# Patient Record
Sex: Female | Born: 1965 | Race: Black or African American | Hispanic: No | Marital: Married | Smoking: Former smoker
Health system: Southern US, Community
[De-identification: ages and names within clinical notes are randomized; demographics above are authoritative.]

## PROBLEM LIST (undated history)

## (undated) DIAGNOSIS — E7800 Pure hypercholesterolemia, unspecified: Secondary | ICD-10-CM

## (undated) DIAGNOSIS — E559 Vitamin D deficiency, unspecified: Secondary | ICD-10-CM

## (undated) DIAGNOSIS — F99 Mental disorder, not otherwise specified: Secondary | ICD-10-CM

## (undated) HISTORY — DX: Vitamin D deficiency, unspecified: E55.9

## (undated) HISTORY — DX: Mental disorder, not otherwise specified: F99

## (undated) HISTORY — DX: Pure hypercholesterolemia, unspecified: E78.00

---

## 2003-09-02 ENCOUNTER — Emergency Department (HOSPITAL_COMMUNITY): Admission: EM | Admit: 2003-09-02 | Discharge: 2003-09-02 | Payer: Self-pay | Admitting: Emergency Medicine

## 2003-10-22 ENCOUNTER — Ambulatory Visit (HOSPITAL_COMMUNITY): Admission: RE | Admit: 2003-10-22 | Discharge: 2003-10-22 | Payer: Self-pay

## 2004-02-01 ENCOUNTER — Inpatient Hospital Stay (HOSPITAL_COMMUNITY): Admission: AD | Admit: 2004-02-01 | Discharge: 2004-02-01 | Payer: Self-pay | Admitting: Obstetrics

## 2004-03-25 ENCOUNTER — Observation Stay (HOSPITAL_COMMUNITY): Admission: AD | Admit: 2004-03-25 | Discharge: 2004-03-25 | Payer: Self-pay | Admitting: Obstetrics

## 2004-03-30 ENCOUNTER — Inpatient Hospital Stay (HOSPITAL_COMMUNITY): Admission: AD | Admit: 2004-03-30 | Discharge: 2004-03-30 | Payer: Self-pay | Admitting: Obstetrics

## 2004-03-30 ENCOUNTER — Inpatient Hospital Stay (HOSPITAL_COMMUNITY): Admission: AD | Admit: 2004-03-30 | Discharge: 2004-04-02 | Payer: Self-pay | Admitting: Obstetrics

## 2004-07-18 ENCOUNTER — Emergency Department (HOSPITAL_COMMUNITY): Admission: EM | Admit: 2004-07-18 | Discharge: 2004-07-19 | Payer: Self-pay | Admitting: Emergency Medicine

## 2006-03-04 ENCOUNTER — Inpatient Hospital Stay: Payer: Self-pay | Admitting: Unknown Physician Specialty

## 2008-02-18 ENCOUNTER — Encounter: Admission: RE | Admit: 2008-02-18 | Discharge: 2008-02-18 | Payer: Self-pay | Admitting: Obstetrics & Gynecology

## 2009-10-30 ENCOUNTER — Inpatient Hospital Stay: Payer: Self-pay | Admitting: Psychiatry

## 2010-03-10 ENCOUNTER — Ambulatory Visit (HOSPITAL_COMMUNITY): Admission: RE | Admit: 2010-03-10 | Discharge: 2010-03-10 | Payer: Self-pay | Admitting: Obstetrics & Gynecology

## 2010-11-27 ENCOUNTER — Encounter: Payer: Self-pay | Admitting: Obstetrics & Gynecology

## 2010-11-27 ENCOUNTER — Encounter: Payer: Self-pay | Admitting: Family Medicine

## 2013-01-01 ENCOUNTER — Other Ambulatory Visit (HOSPITAL_COMMUNITY): Payer: Self-pay | Admitting: Obstetrics & Gynecology

## 2013-01-01 DIAGNOSIS — Z1231 Encounter for screening mammogram for malignant neoplasm of breast: Secondary | ICD-10-CM

## 2013-01-15 ENCOUNTER — Ambulatory Visit (HOSPITAL_COMMUNITY)
Admission: RE | Admit: 2013-01-15 | Discharge: 2013-01-15 | Disposition: A | Discharge status: Home / Self Care | Payer: BC Managed Care – PPO | Source: Ambulatory Visit | Attending: Obstetrics & Gynecology | Admitting: Obstetrics & Gynecology

## 2013-01-15 DIAGNOSIS — Z1231 Encounter for screening mammogram for malignant neoplasm of breast: Secondary | ICD-10-CM

## 2013-01-17 ENCOUNTER — Other Ambulatory Visit: Payer: Self-pay | Admitting: Obstetrics & Gynecology

## 2013-01-17 DIAGNOSIS — R928 Other abnormal and inconclusive findings on diagnostic imaging of breast: Secondary | ICD-10-CM

## 2013-02-07 ENCOUNTER — Ambulatory Visit
Admission: RE | Admit: 2013-02-07 | Discharge: 2013-02-07 | Disposition: A | Discharge status: Home / Self Care | Payer: BC Managed Care – PPO | Source: Ambulatory Visit | Attending: Obstetrics & Gynecology | Admitting: Obstetrics & Gynecology

## 2013-02-07 ENCOUNTER — Encounter: Payer: Self-pay | Admitting: Obstetrics & Gynecology

## 2013-02-07 DIAGNOSIS — R928 Other abnormal and inconclusive findings on diagnostic imaging of breast: Secondary | ICD-10-CM

## 2013-08-28 ENCOUNTER — Other Ambulatory Visit: Payer: Self-pay | Admitting: Obstetrics & Gynecology

## 2013-08-28 DIAGNOSIS — N649 Disorder of breast, unspecified: Secondary | ICD-10-CM

## 2013-09-12 ENCOUNTER — Ambulatory Visit
Admission: RE | Admit: 2013-09-12 | Discharge: 2013-09-12 | Disposition: A | Discharge status: Home / Self Care | Payer: BC Managed Care – PPO | Source: Ambulatory Visit | Attending: Obstetrics & Gynecology | Admitting: Obstetrics & Gynecology

## 2013-09-12 DIAGNOSIS — N649 Disorder of breast, unspecified: Secondary | ICD-10-CM

## 2014-04-27 ENCOUNTER — Other Ambulatory Visit: Payer: Self-pay | Admitting: Obstetrics & Gynecology

## 2014-04-27 DIAGNOSIS — R922 Inconclusive mammogram: Secondary | ICD-10-CM

## 2014-04-27 DIAGNOSIS — R9230 Dense breasts, unspecified: Secondary | ICD-10-CM

## 2014-05-06 ENCOUNTER — Ambulatory Visit
Admission: RE | Admit: 2014-05-06 | Discharge: 2014-05-06 | Disposition: A | Discharge status: Home / Self Care | Payer: BC Managed Care – PPO | Source: Ambulatory Visit | Attending: Obstetrics & Gynecology | Admitting: Obstetrics & Gynecology

## 2014-05-06 ENCOUNTER — Encounter (INDEPENDENT_AMBULATORY_CARE_PROVIDER_SITE_OTHER): Payer: Self-pay

## 2014-05-06 DIAGNOSIS — R922 Inconclusive mammogram: Secondary | ICD-10-CM

## 2014-06-29 ENCOUNTER — Encounter: Payer: Self-pay | Admitting: Obstetrics & Gynecology

## 2014-06-29 ENCOUNTER — Ambulatory Visit (INDEPENDENT_AMBULATORY_CARE_PROVIDER_SITE_OTHER): Payer: BC Managed Care – PPO | Admitting: Obstetrics & Gynecology

## 2014-06-29 VITALS — BP 120/80 | Temp 97.3°F | Ht 68.0 in | Wt 161.0 lb

## 2014-06-29 DIAGNOSIS — Z30433 Encounter for removal and reinsertion of intrauterine contraceptive device: Secondary | ICD-10-CM

## 2014-06-29 DIAGNOSIS — Z01812 Encounter for preprocedural laboratory examination: Secondary | ICD-10-CM

## 2014-06-29 DIAGNOSIS — Z01419 Encounter for gynecological examination (general) (routine) without abnormal findings: Secondary | ICD-10-CM

## 2014-06-29 DIAGNOSIS — Z113 Encounter for screening for infections with a predominantly sexual mode of transmission: Secondary | ICD-10-CM

## 2014-06-29 DIAGNOSIS — Z30430 Encounter for insertion of intrauterine contraceptive device: Secondary | ICD-10-CM

## 2014-06-29 DIAGNOSIS — Z3202 Encounter for pregnancy test, result negative: Secondary | ICD-10-CM

## 2014-06-29 LAB — POCT URINE PREGNANCY: Preg Test, Ur: NEGATIVE

## 2014-06-30 LAB — HIV ANTIBODY (ROUTINE TESTING W REFLEX): HIV 1&2 Ab, 4th Generation: NONREACTIVE

## 2014-06-30 LAB — RPR

## 2014-06-30 NOTE — Progress Notes (Signed)
IUD Insertion Procedure Note  Pre-operative Diagnosis: Requests removal/insertion of a Paragard IUD  Post-operative Diagnosis: same  Indications: contraception  Procedure Details  Urine pregnancy test was done and result was negative.  The risks (including infection, bleeding, pain, and uterine perforation) and benefits of the procedure were explained to the patient and Written informed consent was obtained.     The IUD string was grasped and the IUD was removed intact. Cervix cleansed with Betadine. Uterus sounded to 8 cm. IUD inserted without difficulty. String visible and trimmed. Patient tolerated procedure well.  IUD Information: ParaGard.  Condition: Stable  Complications: None  Plan:  The patient was advised to call for any fever or for prolonged or severe pain or bleeding. She was advised to use OTC analgesics as needed for mild to moderate pain.   Subjective:     Jennifer Wang is a 48 y.o. female here for a routine exam.  Current complaints: none.    Personal health questionnaire:  Is patient Ashkenazi Jewish, have a family history of breast and/or ovarian cancer: no Is there a family history of uterine cancer diagnosed at age < 4, gastrointestinal cancer, urinary tract cancer, family member who is a Personnel officer syndrome-associated carrier: no Is the patient overweight and hypertensive, family history of diabetes, personal history of gestational diabetes or PCOS: no Is patient over 52, have PCOS,  family history of premature CHD under age 60, diabetes, smoke, have hypertension or peripheral artery disease:  yes At any time, has a partner hit, kicked or otherwise hurt or frightened you?: no Over the past 2 weeks, have you felt down, depressed or hopeless?: no Over the past 2 weeks, have you felt little interest or pleasure in doing things?:no   Gynecologic History Patient's last menstrual period was 06/26/2014. Contraception: IUD Last Pap: 2014. Results were:  abnormal, LSIL Last mammogram results were: normal  Obstetric History OB History  No data available    Past Medical History  Diagnosis Date  . High cholesterol   . Mental disorder     Bipolar Disorder  . Vitamin D deficiency     History reviewed. No pertinent past surgical history.  Current outpatient prescriptions:Atorvastatin Calcium (LIPITOR PO), Take by mouth., Disp: , Rfl: ;  CarBAMazepine (TEGRETOL PO), Take by mouth., Disp: , Rfl: ;  Cholecalciferol (VITAMIN D PO), Take by mouth., Disp: , Rfl: ;  Ziprasidone HCl (GEODON PO), Take by mouth., Disp: , Rfl:  No Known Allergies  History  Substance Use Topics  . Smoking status: Former Games developer  . Smokeless tobacco: Never Used  . Alcohol Use: No    History reviewed. No pertinent family history.    Review of Systems  Constitutional: negative for fatigue and weight loss Respiratory: negative for cough and wheezing Cardiovascular: negative for chest pain, fatigue and palpitations Gastrointestinal: negative for abdominal pain and change in bowel habits Musculoskeletal:negative for myalgias Neurological: negative for gait problems and tremors Behavioral/Psych: negative for abusive relationship, depression Endocrine: negative for temperature intolerance   Genitourinary:negative for abnormal menstrual periods, genital lesions, hot flashes, sexual problems and vaginal discharge Integument/breast: negative for breast lump, breast tenderness, nipple discharge and skin lesion(s)    Objective:       BP 120/80  Temp(Src) 97.3 F (36.3 C)  Ht  (1.727 m)  Wt 73.029 kg (161 lb)  BMI 24.49 kg/m2  LMP 06/26/2014 General:   alert  Skin:   no rash or abnormalities  Lungs:   clear to auscultation bilaterally  Heart:  regular rate and rhythm, S1, S2 normal, no murmur, click, rub or gallop  Breasts:   normal without suspicious masses, skin or nipple changes or axillary nodes  Abdomen:  normal findings: no organomegaly, soft,  non-tender and no hernia  Pelvis:  External genitalia: normal general appearance Urinary system: urethral meatus normal and bladder without fullness, nontender Vaginal: normal without tenderness, induration or masses Cervix: normal appearance Adnexa: normal bimanual exam Uterus: anteverted and non-tender, normal size   Lab Review Urine pregnancy test Labs reviewed yes Radiologic studies reviewed no    Assessment:    Healthy female exam.  S/P ParaGard IUD removal/placement   Plan:    Education reviewed: calcium supplements, low fat, low cholesterol diet and weight bearing exercise.   Meds ordered this encounter  Medications  . CarBAMazepine (TEGRETOL PO)    Sig: Take by mouth.  . Atorvastatin Calcium (LIPITOR PO)    Sig: Take by mouth.  . Cholecalciferol (VITAMIN D PO)    Sig: Take by mouth.  . Ziprasidone HCl (GEODON PO)    Sig: Take by mouth.   Orders Placed This Encounter  Procedures  . HIV antibody  . RPR  . POCT urine pregnancy    Follow up as needed.

## 2014-06-30 NOTE — Patient Instructions (Signed)

## 2014-07-02 LAB — PAP IG, CT-NG NAA, HPV HIGH-RISK
Chlamydia Probe Amp: NEGATIVE
GC Probe Amp: NEGATIVE
HPV DNA High Risk: NOT DETECTED

## 2014-09-21 ENCOUNTER — Ambulatory Visit: Payer: Self-pay | Admitting: Obstetrics & Gynecology

## 2014-11-02 ENCOUNTER — Encounter: Payer: Self-pay | Admitting: *Deleted

## 2014-11-03 ENCOUNTER — Encounter: Payer: Self-pay | Admitting: Obstetrics & Gynecology

## 2016-01-19 ENCOUNTER — Other Ambulatory Visit: Payer: Self-pay | Admitting: Family Medicine

## 2016-01-19 DIAGNOSIS — Z1231 Encounter for screening mammogram for malignant neoplasm of breast: Secondary | ICD-10-CM

## 2016-02-04 ENCOUNTER — Ambulatory Visit
Admission: RE | Admit: 2016-02-04 | Discharge: 2016-02-04 | Disposition: A | Discharge status: Home / Self Care | Payer: BLUE CROSS/BLUE SHIELD | Source: Ambulatory Visit | Attending: Family Medicine | Admitting: Family Medicine

## 2016-02-04 DIAGNOSIS — Z1231 Encounter for screening mammogram for malignant neoplasm of breast: Secondary | ICD-10-CM

## 2019-08-13 ENCOUNTER — Ambulatory Visit
Admission: RE | Admit: 2019-08-13 | Discharge: 2019-08-13 | Disposition: A | Discharge status: Home / Self Care | Payer: BC Managed Care – PPO | Source: Ambulatory Visit | Attending: Family Medicine | Admitting: Family Medicine

## 2019-08-13 ENCOUNTER — Other Ambulatory Visit: Payer: Self-pay | Admitting: Family Medicine

## 2019-08-13 DIAGNOSIS — T1490XA Injury, unspecified, initial encounter: Secondary | ICD-10-CM

## 2019-08-20 ENCOUNTER — Other Ambulatory Visit: Payer: Self-pay | Admitting: Family Medicine

## 2019-08-20 DIAGNOSIS — Z1231 Encounter for screening mammogram for malignant neoplasm of breast: Secondary | ICD-10-CM

## 2019-10-08 ENCOUNTER — Ambulatory Visit: Payer: BC Managed Care – PPO

## 2020-08-20 ENCOUNTER — Other Ambulatory Visit: Payer: Self-pay | Admitting: Family Medicine

## 2020-08-20 DIAGNOSIS — Z1231 Encounter for screening mammogram for malignant neoplasm of breast: Secondary | ICD-10-CM

## 2020-08-23 ENCOUNTER — Ambulatory Visit: Payer: BC Managed Care – PPO

## 2020-08-23 ENCOUNTER — Other Ambulatory Visit: Payer: Self-pay

## 2022-02-15 ENCOUNTER — Encounter: Payer: Self-pay | Admitting: Obstetrics & Gynecology

## 2022-02-15 ENCOUNTER — Ambulatory Visit (INDEPENDENT_AMBULATORY_CARE_PROVIDER_SITE_OTHER): Payer: 59 | Admitting: Obstetrics & Gynecology

## 2022-02-15 ENCOUNTER — Other Ambulatory Visit (HOSPITAL_COMMUNITY)
Admission: RE | Admit: 2022-02-15 | Discharge: 2022-02-15 | Disposition: A | Discharge status: Home / Self Care | Payer: 59 | Source: Ambulatory Visit | Attending: Obstetrics & Gynecology | Admitting: Obstetrics & Gynecology

## 2022-02-15 VITALS — BP 101/51 | HR 76 | Ht 65.0 in | Wt 195.0 lb

## 2022-02-15 DIAGNOSIS — Z01419 Encounter for gynecological examination (general) (routine) without abnormal findings: Secondary | ICD-10-CM

## 2022-02-15 DIAGNOSIS — Z1231 Encounter for screening mammogram for malignant neoplasm of breast: Secondary | ICD-10-CM

## 2022-02-15 DIAGNOSIS — Z1211 Encounter for screening for malignant neoplasm of colon: Secondary | ICD-10-CM

## 2022-02-15 NOTE — Progress Notes (Signed)
? ? ?GYNECOLOGY ANNUAL PREVENTATIVE CARE ENCOUNTER NOTE ? ?History:    ? Jennifer Wang is a postmenopausal 56 y.o. [id number] female here for a routine annual gynecologic exam.  Current complaints: none.   Denies abnormal vaginal bleeding, discharge, pelvic pain, problems with intercourse or other gynecologic concerns.  ?  ?Gynecologic History ?Patient's last menstrual period was 06/26/2014. ?Contraception: IUD in place ?Last Pap: 06/29/2014. Result was normal with negative HPV ?Last Mammogram: 02/04/2016.  Result was normal ?Last Colonoscopy: never had one ? ?Obstetric History ?OB History  ?Gravida Para Term Preterm AB Living  ?4 4 4     3   ?SAB IAB Ectopic Multiple Live Births  ?        3  ?  ?# Outcome Date GA Lbr Len/2nd Weight Sex Delivery Anes PTL Lv  ?4 Term 2005 [redacted]w[redacted]d   F Vag-Spont None N LIV  ?3 Term 2003 [redacted]w[redacted]d   F Vag-Spont None N   ?2 Term 1998 [redacted]w[redacted]d   F Vag-Spont Local N LIV  ?1 Term 1996 [redacted]w[redacted]d   M Vag-Spont None N LIV  ? ? ?Past Medical History:  ?Diagnosis Date  ? High cholesterol   ? Mental disorder   ? Bipolar Disorder  ? Vitamin D deficiency   ? ? ?History reviewed. No pertinent surgical history. ? ?Current Outpatient Medications on File Prior to Visit  ?Medication Sig Dispense Refill  ? Ziprasidone HCl (GEODON PO) Take by mouth.    ? Atorvastatin Calcium (LIPITOR PO) Take by mouth. (Patient not taking: Reported on 02/15/2022)    ? CarBAMazepine (TEGRETOL PO) Take by mouth. (Patient not taking: Reported on 02/15/2022)    ? Cholecalciferol (VITAMIN D PO) Take by mouth. (Patient not taking: Reported on 02/15/2022)    ? ?No current facility-administered medications on file prior to visit.  ? ? ?No Known Allergies ? ?Social History:  reports that she has quit smoking. She has never used smokeless tobacco. She reports that she does not drink alcohol and does not use drugs. ? ?History reviewed. No pertinent family history. ? ?The following portions of the patient's history were reviewed and updated as  appropriate: allergies, current medications, past family history, past medical history, past social history, past surgical history and problem list. ? ?Review of Systems ?Pertinent items noted in HPI and remainder of comprehensive ROS otherwise negative. ? ?Physical Exam:  ?BP (!) 101/51   Pulse 76   Ht 5\' 5"  (1.651 m)   Wt 195 lb (88.5 kg)   LMP 06/26/2014   BMI 32.45 kg/m?  ?CONSTITUTIONAL: Well-developed, well-nourished female in no acute distress.  ?HENT:  Normocephalic, atraumatic, External right and left ear normal.  ?EYES: Conjunctivae and EOM are normal. Pupils are equal, round, and reactive to light. No scleral icterus.  ?NECK: Normal range of motion, supple, no masses.  Normal thyroid.  ?SKIN: Skin is warm and dry. No rash noted. Not diaphoretic. No erythema. No pallor. ?MUSCULOSKELETAL: Normal range of motion. No tenderness.  No cyanosis, clubbing, or edema. ?NEUROLOGIC: Alert and oriented to person, place, and time. Normal reflexes, muscle tone coordination.  ?PSYCHIATRIC: Normal mood and affect. Normal behavior. Normal judgment and thought content. ?CARDIOVASCULAR: Normal heart rate noted, regular rhythm ?RESPIRATORY: Clear to auscultation bilaterally. Effort and breath sounds normal, no problems with respiration noted. ?BREASTS: Symmetric in size. No masses, tenderness, skin changes, nipple drainage, or lymphadenopathy bilaterally. Performed in the presence of a chaperone. ?ABDOMEN: Soft, no distention noted.  No tenderness, rebound or guarding.  ?PELVIC: Normal appearing  external genitalia and urethral meatus; normal appearing vaginal mucosa and cervix.  No abnormal vaginal discharge noted.  Pap smear obtained, there was some bleeding ameliorated by silver nitrate. IUD strings seen about 7 mm in length outside cervix.  Normal uterine size, no other palpable masses, no uterine or adnexal tenderness.  Performed in the presence of a chaperone. ?  ?Assessment and Plan:  ?   ?1. Screening for colon  cancer ?Referral made to Gastroenterology for colonoscopy ?- Ambulatory referral to Gastroenterology ? ?2. Breast cancer screening by mammogram ?Mammogram to be scheduled ?- MM 3D SCREEN BREAST BILATERAL; Future ? ?3. Well woman exam with routine gynecological exam ?- Cytology - PAP( [place]) ?Will follow up results of pap smear and manage accordingly. ?Routine preventative health maintenance measures emphasized. ?Please refer to After Visit Summary for other counseling recommendations.  ?   ? ?Verita Schneiders, MD, FACOG ?Obstetrician Social research officer, government, Faculty Practice ?[facility] ?

## 2022-02-17 LAB — CYTOLOGY - PAP
Comment: NEGATIVE
Diagnosis: NEGATIVE
High risk HPV: NEGATIVE

## 2022-02-21 ENCOUNTER — Encounter (HOSPITAL_BASED_OUTPATIENT_CLINIC_OR_DEPARTMENT_OTHER): Payer: Self-pay

## 2022-02-21 ENCOUNTER — Ambulatory Visit (HOSPITAL_BASED_OUTPATIENT_CLINIC_OR_DEPARTMENT_OTHER)
Admission: RE | Admit: 2022-02-21 | Discharge: 2022-02-21 | Disposition: A | Discharge status: Home / Self Care | Payer: 59 | Source: Ambulatory Visit | Attending: Obstetrics & Gynecology | Admitting: Obstetrics & Gynecology

## 2022-02-21 DIAGNOSIS — Z1231 Encounter for screening mammogram for malignant neoplasm of breast: Secondary | ICD-10-CM | POA: Insufficient documentation

## 2023-01-05 ENCOUNTER — Emergency Department (HOSPITAL_BASED_OUTPATIENT_CLINIC_OR_DEPARTMENT_OTHER)
Admission: EM | Admit: 2023-01-05 | Discharge: 2023-01-05 | Disposition: A | Discharge status: Home / Self Care | Payer: No Typology Code available for payment source | Attending: Emergency Medicine | Admitting: Emergency Medicine

## 2023-01-05 ENCOUNTER — Emergency Department (HOSPITAL_BASED_OUTPATIENT_CLINIC_OR_DEPARTMENT_OTHER): Payer: No Typology Code available for payment source

## 2023-01-05 ENCOUNTER — Encounter (HOSPITAL_BASED_OUTPATIENT_CLINIC_OR_DEPARTMENT_OTHER): Payer: Self-pay | Admitting: *Deleted

## 2023-01-05 ENCOUNTER — Other Ambulatory Visit: Payer: Self-pay

## 2023-01-05 DIAGNOSIS — M25511 Pain in right shoulder: Secondary | ICD-10-CM | POA: Insufficient documentation

## 2023-01-05 DIAGNOSIS — M25512 Pain in left shoulder: Secondary | ICD-10-CM | POA: Insufficient documentation

## 2023-01-05 DIAGNOSIS — M542 Cervicalgia: Secondary | ICD-10-CM | POA: Diagnosis not present

## 2023-01-05 DIAGNOSIS — M79652 Pain in left thigh: Secondary | ICD-10-CM | POA: Insufficient documentation

## 2023-01-05 DIAGNOSIS — R519 Headache, unspecified: Secondary | ICD-10-CM | POA: Diagnosis not present

## 2023-01-05 DIAGNOSIS — M25552 Pain in left hip: Secondary | ICD-10-CM | POA: Insufficient documentation

## 2023-01-05 DIAGNOSIS — M79651 Pain in right thigh: Secondary | ICD-10-CM | POA: Diagnosis not present

## 2023-01-05 DIAGNOSIS — Y92410 Unspecified street and highway as the place of occurrence of the external cause: Secondary | ICD-10-CM | POA: Diagnosis not present

## 2023-01-05 DIAGNOSIS — S3992XA Unspecified injury of lower back, initial encounter: Secondary | ICD-10-CM | POA: Diagnosis present

## 2023-01-05 DIAGNOSIS — S39012A Strain of muscle, fascia and tendon of lower back, initial encounter: Secondary | ICD-10-CM | POA: Diagnosis not present

## 2023-01-05 DIAGNOSIS — M25551 Pain in right hip: Secondary | ICD-10-CM | POA: Insufficient documentation

## 2023-01-05 MED ORDER — ACETAMINOPHEN 500 MG PO TABS: 1000.0000 mg | ORAL_TABLET | Freq: Once | ORAL | Status: DC
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
## 2023-02-07 IMAGING — MG MM DIGITAL SCREENING BILAT W/ TOMO AND CAD
6 of 10 series · 6 of 30 positions shown · non-contrast
Comparison: Previous exam(s).

CLINICAL DATA: Screening.

EXAM:
DIGITAL SCREENING BILATERAL MAMMOGRAM WITH TOMOSYNTHESIS AND CAD
TECHNIQUE: Bilateral screening digital craniocaudal and mediolateral oblique
mammograms were obtained. Bilateral screening digital breast
tomosynthesis was performed. The images were evaluated with
computer-aided detection.

[R MLO synth-2D]
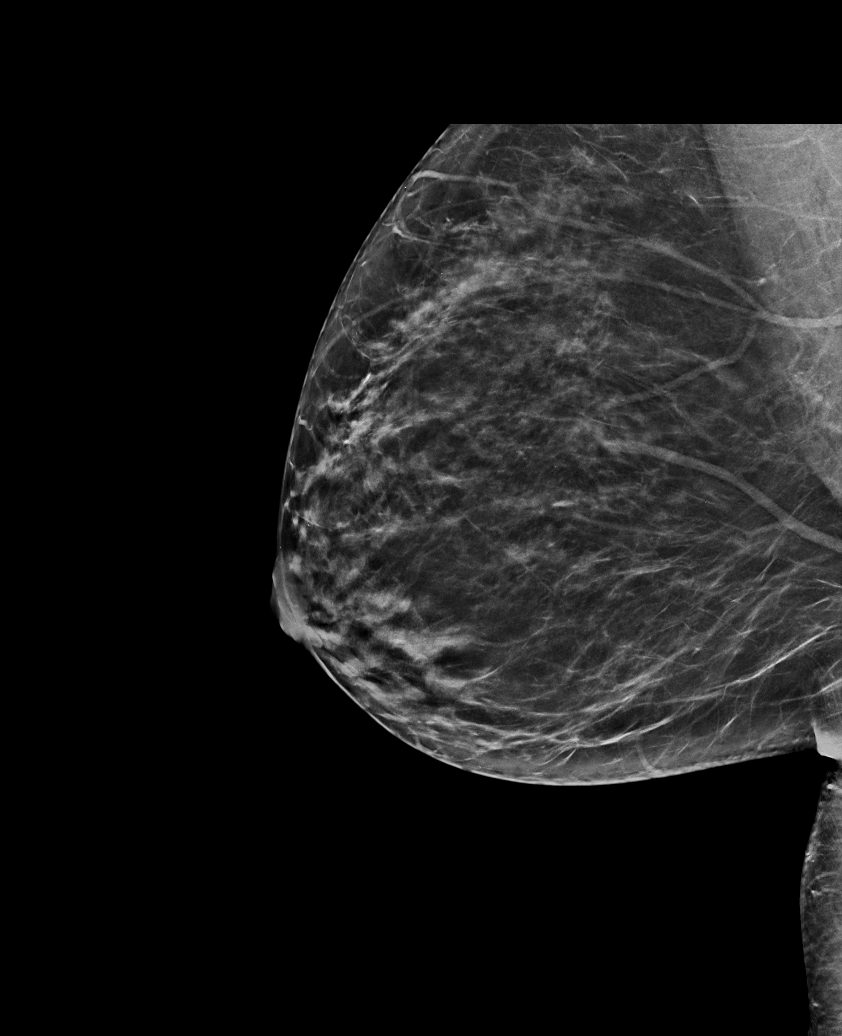

[L MLO synth-2D (1 of 2)]
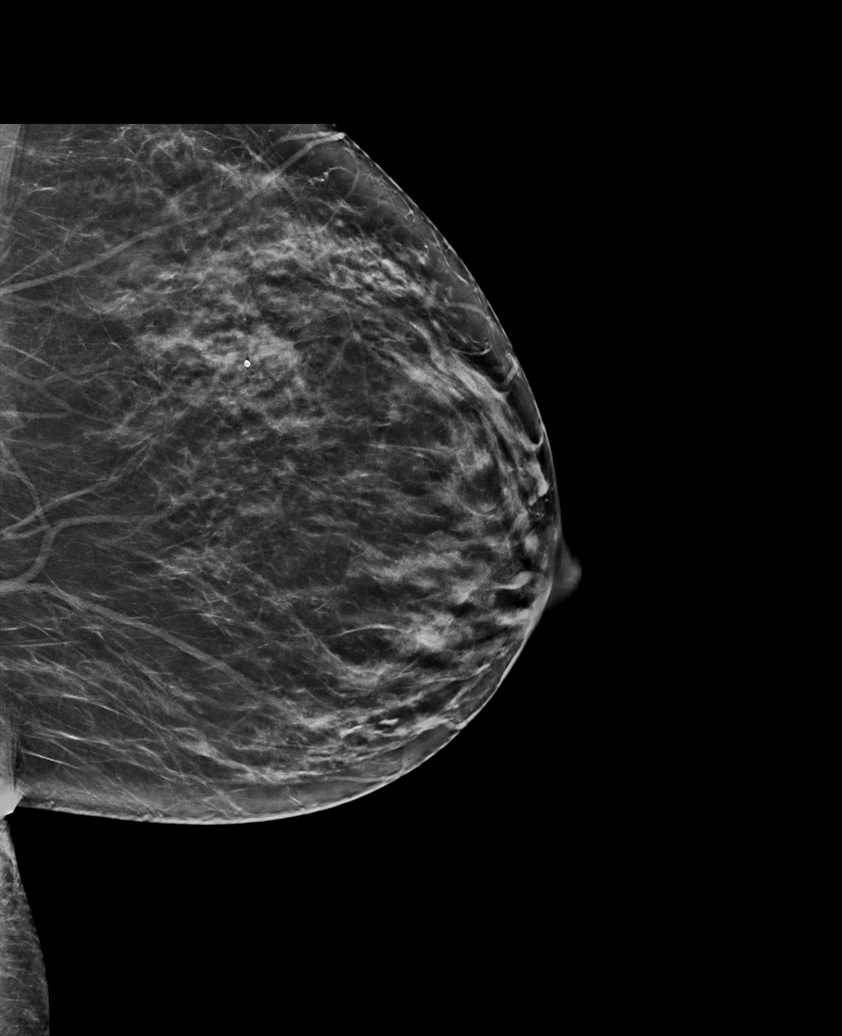

[L CC synth-2D]
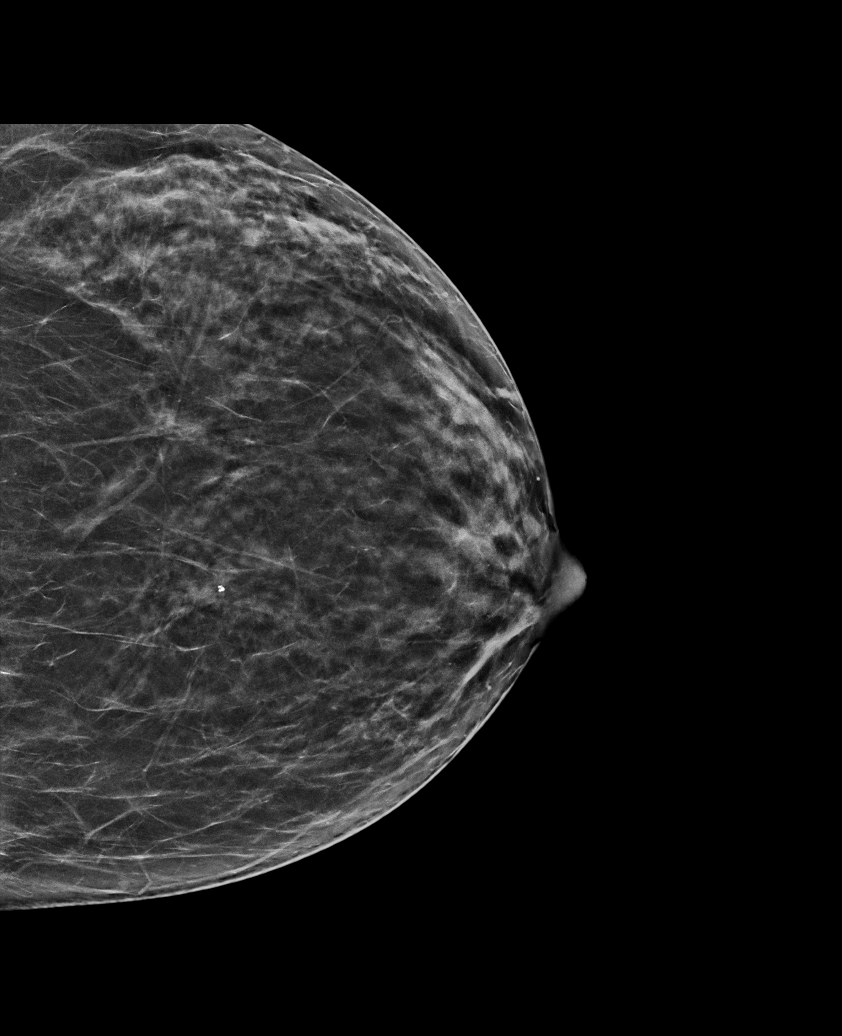

[L MLO synth-2D (2 of 2)]
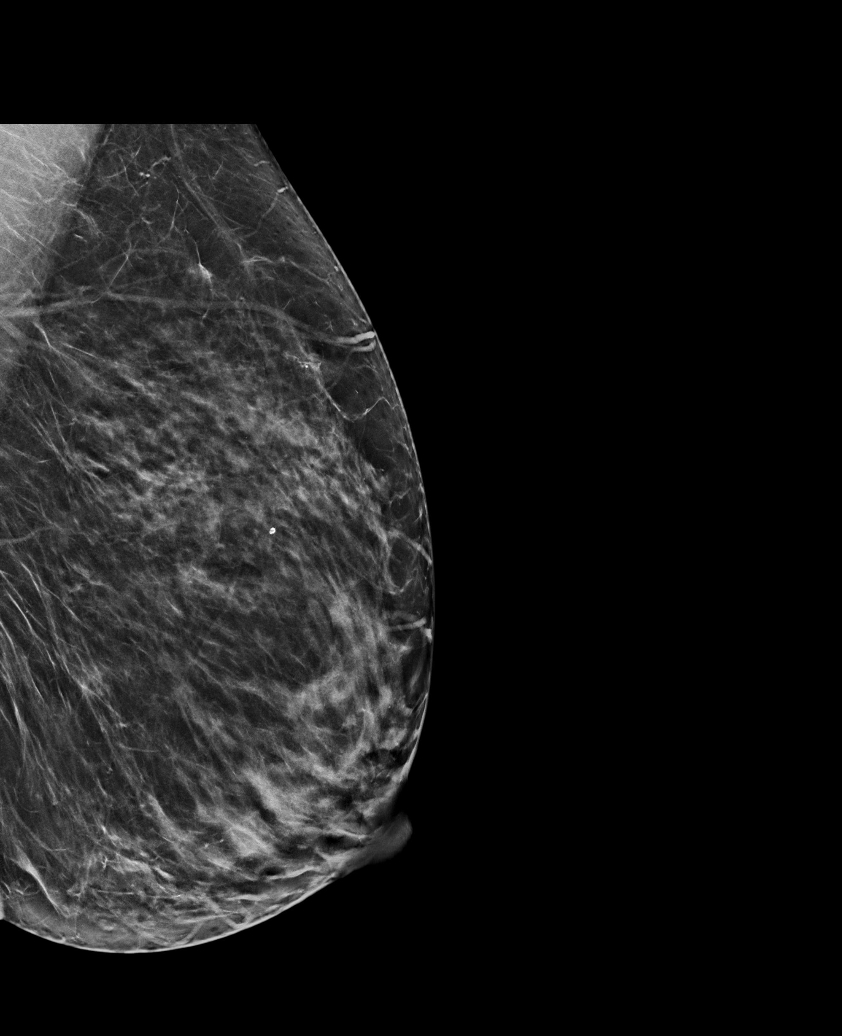

[R CC synth-2D]
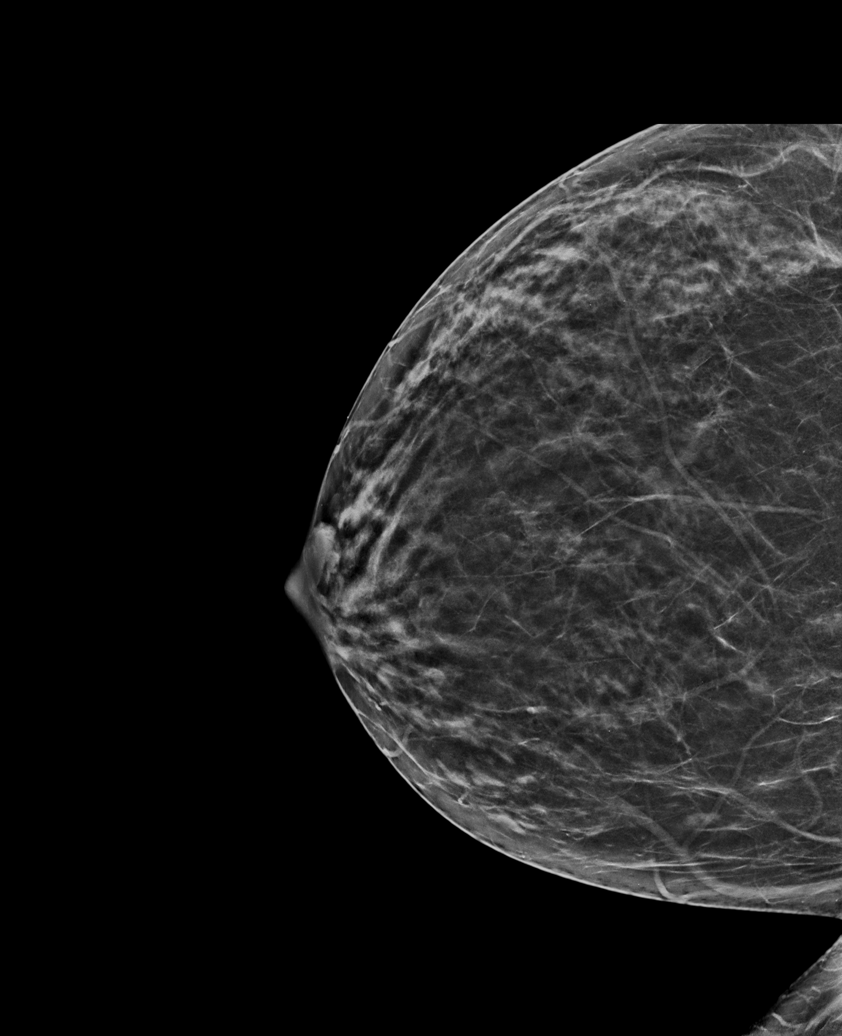

[L CC tomo · tomo slice 33/64.0]
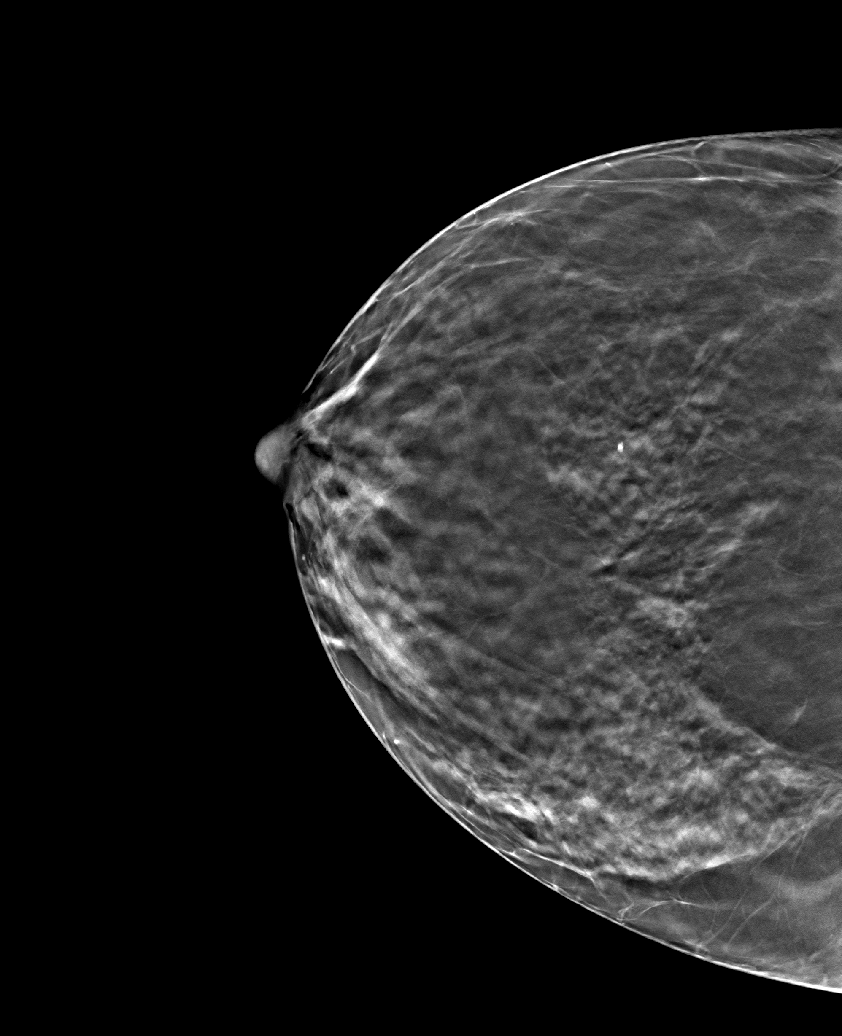

[6 of 30 positions shown; findings below may reference images not displayed]

ACR Breast Density Category c: The breast tissue is heterogeneously
dense, which may obscure small masses.
FINDINGS: There are no findings suspicious for malignancy.
IMPRESSION: No mammographic evidence of malignancy. A result letter of this
screening mammogram will be mailed directly to the patient.

RECOMMENDATION:
Screening mammogram in one year. (Code:Q3-W-BC3)

BI-RADS CATEGORY  1: Negative.

## 2023-08-20 ENCOUNTER — Other Ambulatory Visit (HOSPITAL_BASED_OUTPATIENT_CLINIC_OR_DEPARTMENT_OTHER): Payer: Self-pay | Admitting: Family Medicine

## 2023-08-20 DIAGNOSIS — Z1231 Encounter for screening mammogram for malignant neoplasm of breast: Secondary | ICD-10-CM

## 2023-08-27 ENCOUNTER — Ambulatory Visit (HOSPITAL_BASED_OUTPATIENT_CLINIC_OR_DEPARTMENT_OTHER)
Admission: RE | Admit: 2023-08-27 | Discharge: 2023-08-27 | Disposition: A | Discharge status: Home / Self Care | Payer: BC Managed Care – PPO | Source: Ambulatory Visit | Attending: Family Medicine | Admitting: Family Medicine

## 2023-08-27 ENCOUNTER — Encounter (HOSPITAL_BASED_OUTPATIENT_CLINIC_OR_DEPARTMENT_OTHER): Payer: Self-pay

## 2023-08-27 DIAGNOSIS — Z1231 Encounter for screening mammogram for malignant neoplasm of breast: Secondary | ICD-10-CM | POA: Insufficient documentation

## 2024-05-23 ENCOUNTER — Encounter: Payer: Self-pay | Admitting: Advanced Practice Midwife

## 2024-06-02 ENCOUNTER — Encounter: Payer: Self-pay | Admitting: Obstetrics & Gynecology

## 2024-06-02 ENCOUNTER — Ambulatory Visit (INDEPENDENT_AMBULATORY_CARE_PROVIDER_SITE_OTHER): Admitting: Obstetrics & Gynecology

## 2024-06-02 VITALS — BP 116/58 | HR 68 | Wt 200.0 lb

## 2024-06-02 DIAGNOSIS — Z780 Asymptomatic menopausal state: Secondary | ICD-10-CM | POA: Diagnosis not present

## 2024-06-02 DIAGNOSIS — Z30431 Encounter for routine checking of intrauterine contraceptive device: Secondary | ICD-10-CM

## 2024-06-02 MED ORDER — PARAGARD INTRAUTERINE COPPER IU IUD: 1.0000 | INTRAUTERINE_SYSTEM | Freq: Once | INTRAUTERINE | Status: DC

## 2024-06-02 NOTE — Progress Notes (Signed)
   GYNECOLOGY OFFICE VISIT NOTE  History:  Jennifer Wang is a 58 y.o. [phone] here today for Paragard  IUD removal and replacement.  Patient reports that she is unsure she is menopausal, as she has discharge once in a while. No overt bleeding.  Worried about pregnancy, Paragard  placed in 2015. She denies any current abnormal vaginal discharge, bleeding, pelvic pain or other concerns.  Past Medical History:  Diagnosis Date   High cholesterol    Mental disorder    Bipolar Disorder   Vitamin D deficiency     History reviewed. No pertinent surgical history.  The following portions of the patient's history were reviewed and updated as appropriate: allergies, current medications, past family history, past medical history, past social history, past surgical history and problem list.   Health Maintenance:  Normal pap and negative HRHPV on 02/15/2022.  Normal mammogram on 08/27/2023.   Review of Systems:  Pertinent items noted in HPI and remainder of comprehensive ROS otherwise negative.  Physical Exam:  BP (!) 116/58 (BP Location: Left Arm, Patient Position: Sitting, Cuff Size: Large)   Pulse 68   Wt 200 lb (90.7 kg)   LMP 06/26/2014   BMI 33.28 kg/m  CONSTITUTIONAL: Well-developed, well-nourished female in no acute distress.  HEENT:  Normocephalic, atraumatic. External right and left ear normal. No scleral icterus.  NECK: Normal range of motion, supple, no masses noted on observation SKIN: No rash noted. Not diaphoretic. No erythema. No pallor. MUSCULOSKELETAL: Normal range of motion. No edema noted. NEUROLOGIC: Alert and oriented to person, place, and time. Normal muscle tone coordination. No cranial nerve deficit noted on observation. PSYCHIATRIC: Normal mood and affect. Normal behavior. Normal judgment and thought content. CARDIOVASCULAR: Normal heart rate noted RESPIRATORY: Effort and breath sounds normal, no problems with respiration noted ABDOMEN: No masses or other overt  distention noted on observation. No tenderness.   PELVIC: Deferred   Assessment and Plan:     1. IUD check up 2. Possible late onset menopause (Primary) Offered patient definitive testing to confirm menopausal status.  Labs ordered, will follow up results and manage accordingly. - FSH/LH - Estradiol  If menopausal, IUD replacement not needed, and she can return for IUD removal at any time.  Also emphasized the Paragard  was actually effective for up to 12 years (this was reassuring to her).  If she is menopausal, and having concerning discharge that could be indicative of bleeding, she was told she will need further evaluation as postmenopausal bleeding can be caused by very abnormal pathology (precancerous/cancerous cells).    Routine preventative health maintenance measures emphasized. Please refer to After Visit Summary for other counseling recommendations.   Return for follow up as recommended.    I spent 30 minutes dedicated to the care of this patient including pre-visit review of records, face to face time with the patient discussing her conditions and treatments, post visit ordering of medications and appropriate tests or procedures, coordinating care and documenting this visit encounter.    GLORIS HUGGER, MD, FACOG Obstetrician & Gynecologist, [facility] for [employer], [facility]

## 2024-06-03 ENCOUNTER — Ambulatory Visit: Payer: Self-pay | Admitting: Obstetrics & Gynecology

## 2024-06-03 LAB — FSH/LH
FSH: 68.4 m[IU]/mL
LH: 55.3 m[IU]/mL

## 2024-06-03 LAB — ESTRADIOL: Estradiol: 27.4 pg/mL

## 2024-08-26 ENCOUNTER — Other Ambulatory Visit (HOSPITAL_BASED_OUTPATIENT_CLINIC_OR_DEPARTMENT_OTHER): Payer: Self-pay | Admitting: Family Medicine

## 2024-08-26 DIAGNOSIS — Z1231 Encounter for screening mammogram for malignant neoplasm of breast: Secondary | ICD-10-CM

## 2024-09-08 ENCOUNTER — Ambulatory Visit (HOSPITAL_BASED_OUTPATIENT_CLINIC_OR_DEPARTMENT_OTHER)
Admission: RE | Admit: 2024-09-08 | Discharge: 2024-09-08 | Disposition: A | Discharge status: Home / Self Care | Source: Ambulatory Visit | Attending: Family Medicine | Admitting: Family Medicine

## 2024-09-08 ENCOUNTER — Encounter (HOSPITAL_BASED_OUTPATIENT_CLINIC_OR_DEPARTMENT_OTHER): Payer: Self-pay

## 2024-09-08 DIAGNOSIS — Z1231 Encounter for screening mammogram for malignant neoplasm of breast: Secondary | ICD-10-CM | POA: Diagnosis present
